# Patient Record
Sex: Male | Born: 2000 | Race: Black or African American | Hispanic: No | Marital: Single | Smoking: Never smoker
Health system: Southern US, Community
[De-identification: ages and names within clinical notes are randomized; demographics above are authoritative.]

## PROBLEM LIST (undated history)

## (undated) ENCOUNTER — Ambulatory Visit

## (undated) DIAGNOSIS — S43432S Superior glenoid labrum lesion of left shoulder, sequela: Secondary | ICD-10-CM

## (undated) DIAGNOSIS — S060XAA Concussion with loss of consciousness status unknown, initial encounter: Secondary | ICD-10-CM

## (undated) HISTORY — PX: HERNIA REPAIR: SHX51

---
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
## 2022-10-13 IMAGING — DX DG WRIST COMPLETE 3+V*L*
4 series · 4 of 4 positions shown · non-contrast
Comparison: None.

CLINICAL DATA: Weight lifting injury.  Left wrist pain.

EXAM:
LEFT WRIST - COMPLETE 3+ VIEW

[wrist pa (1 of 2)]
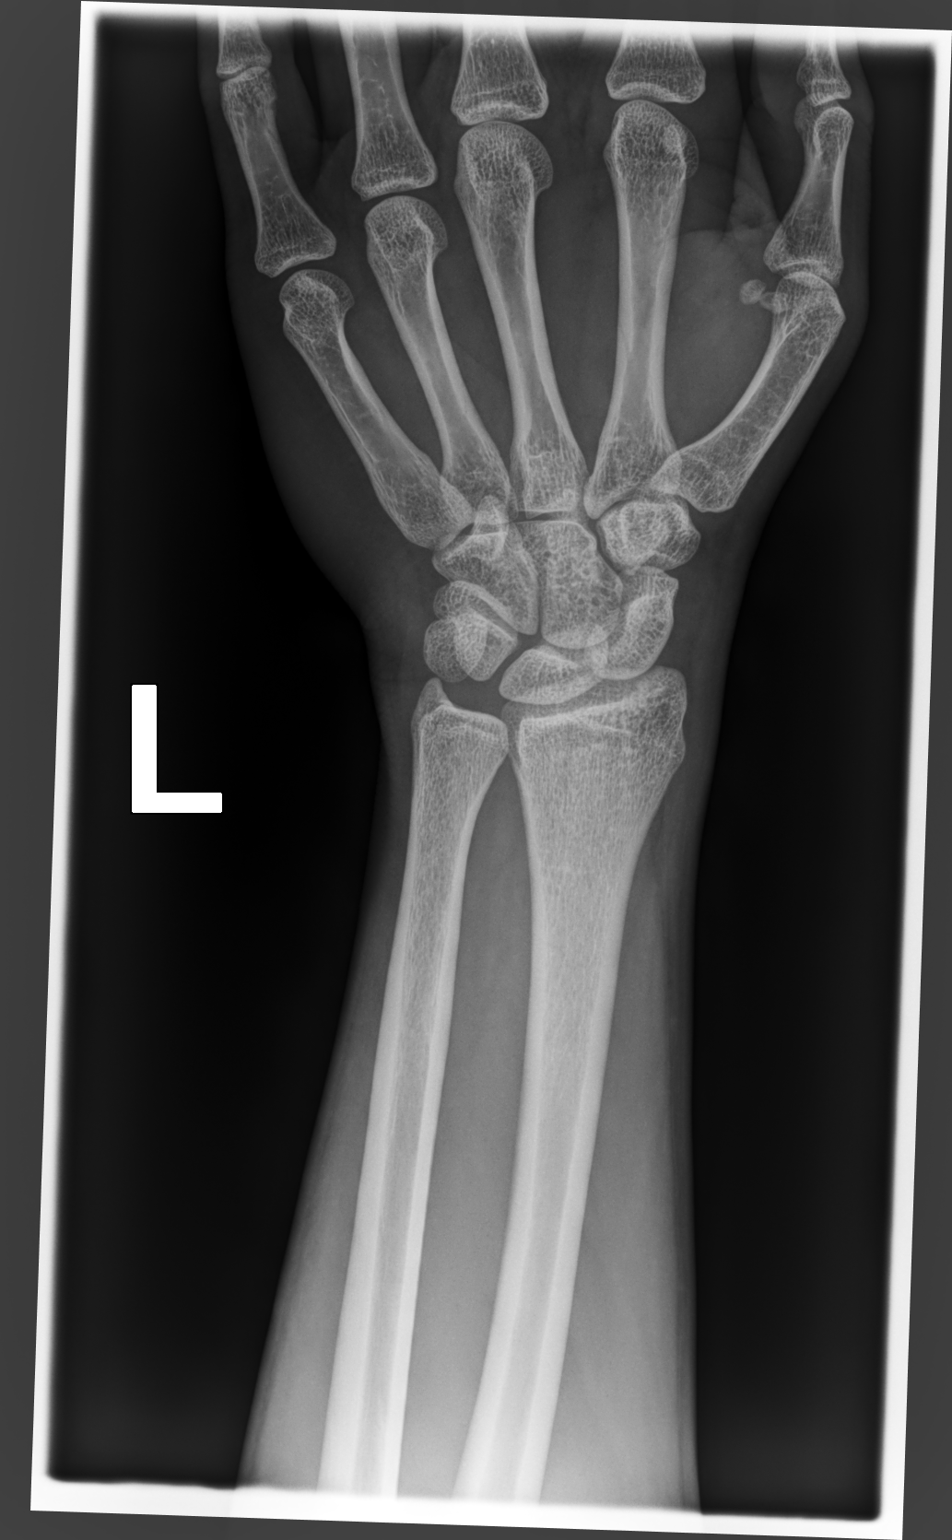

[wrist pa (2 of 2)]
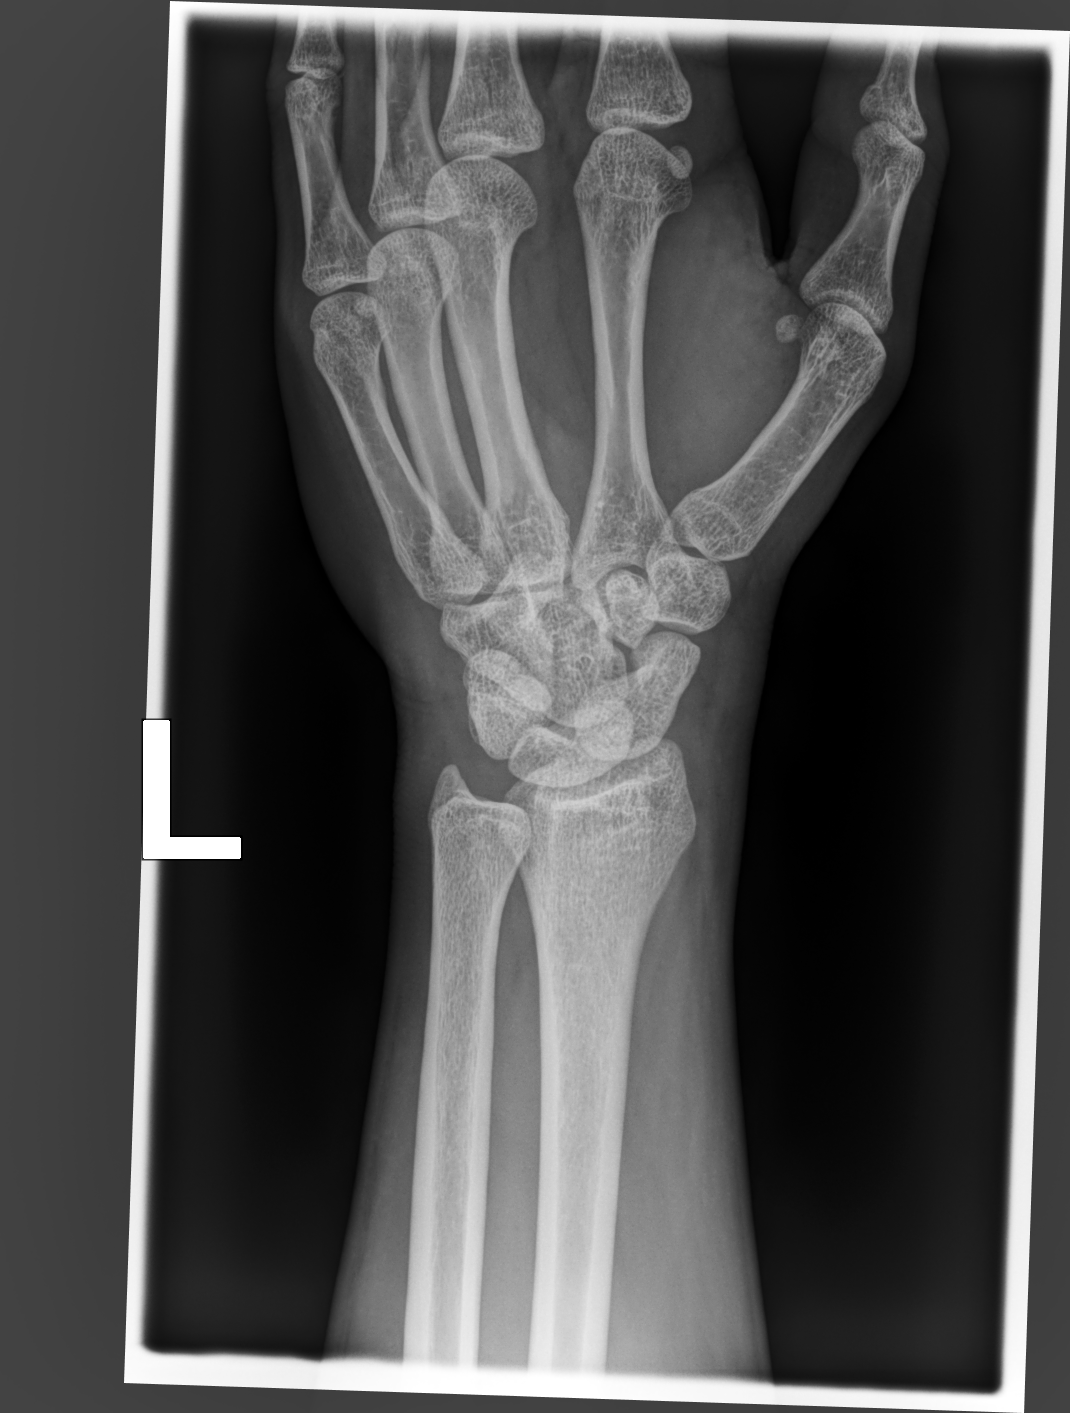

[wrist lat]
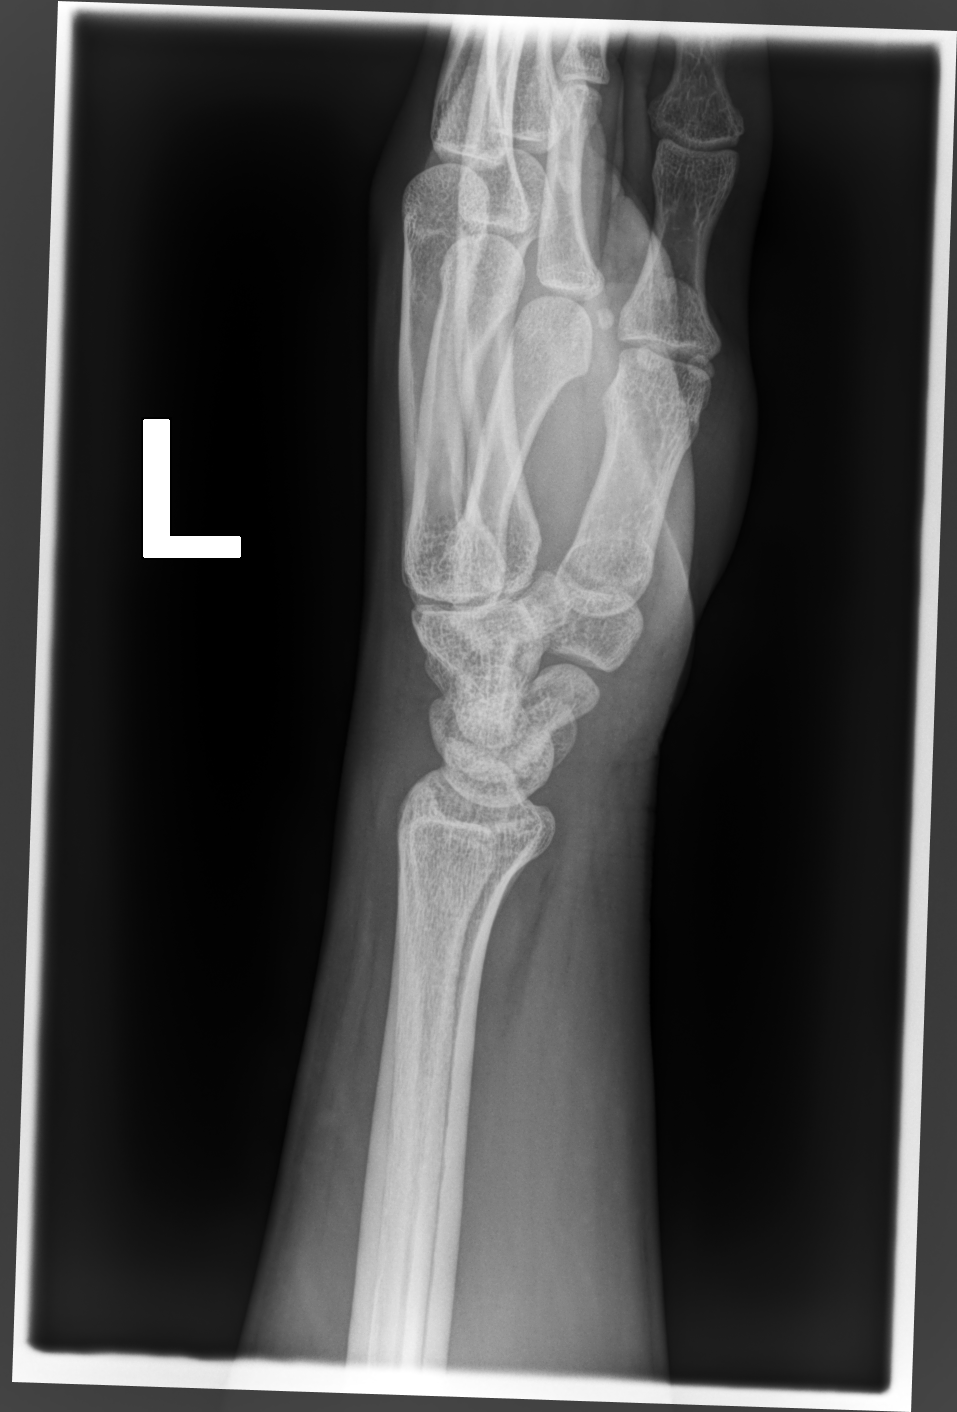

[scaphoid (os scaphoideum i) pa]
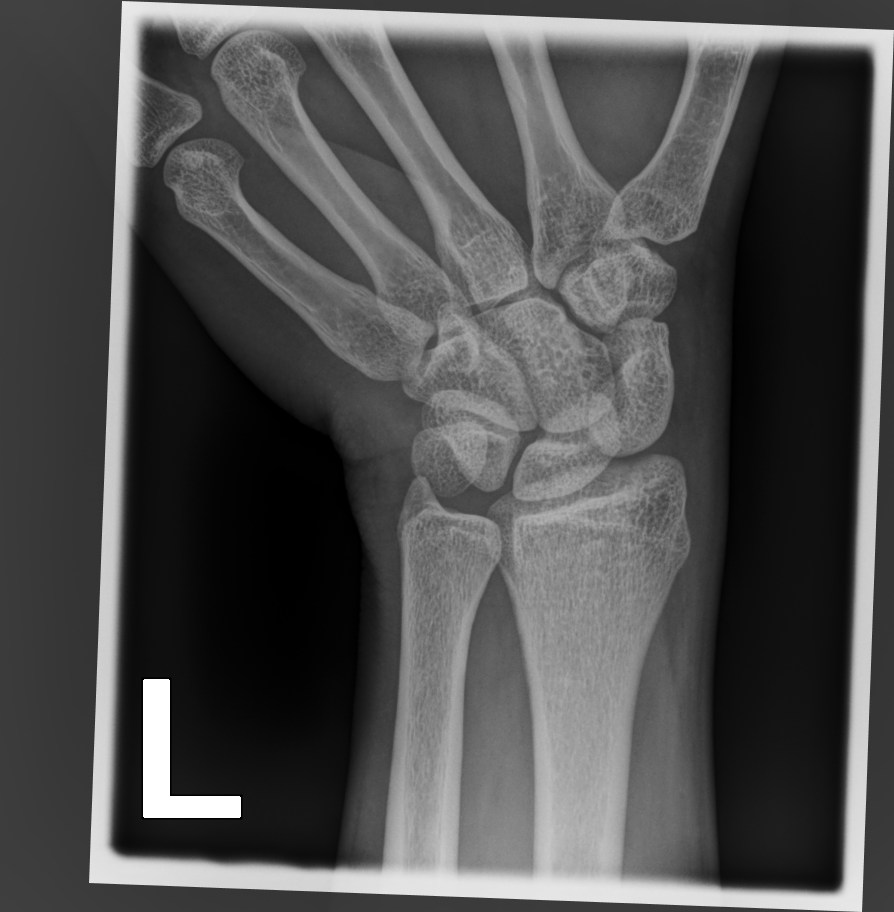

[4 of 4 positions shown; findings below may reference images not displayed]

FINDINGS: Normal bone mineralization. Neutral ulnar variance. Joint spaces are
preserved. No acute fracture is seen. No dislocation.
IMPRESSION: Normal left wrist radiographs.
